# Patient Record
Sex: Female | Born: 1970
Health system: Southern US, Community
[De-identification: ages and names within clinical notes are randomized; demographics above are authoritative.]

---

## 1999-11-07 ENCOUNTER — Encounter: Admission: RE | Admit: 1999-11-07 | Discharge: 1999-11-07 | Payer: Self-pay | Admitting: Otolaryngology

## 1999-11-07 ENCOUNTER — Encounter: Payer: Self-pay | Admitting: Otolaryngology

## 2001-05-06 ENCOUNTER — Other Ambulatory Visit: Admission: RE | Admit: 2001-05-06 | Discharge: 2001-05-06 | Payer: Self-pay | Admitting: Family Medicine

## 2001-07-03 ENCOUNTER — Encounter: Admission: RE | Admit: 2001-07-03 | Discharge: 2001-07-03 | Payer: Self-pay | Admitting: Family Medicine

## 2001-07-03 ENCOUNTER — Encounter: Payer: Self-pay | Admitting: Family Medicine

## 2002-03-03 ENCOUNTER — Encounter: Payer: Self-pay | Admitting: Thoracic Surgery

## 2002-03-03 ENCOUNTER — Inpatient Hospital Stay (HOSPITAL_COMMUNITY): Admission: RE | Admit: 2002-03-03 | Discharge: 2002-03-05 | Payer: Self-pay | Admitting: Thoracic Surgery

## 2002-03-03 ENCOUNTER — Encounter (INDEPENDENT_AMBULATORY_CARE_PROVIDER_SITE_OTHER): Payer: Self-pay | Admitting: Specialist

## 2002-03-04 ENCOUNTER — Encounter: Payer: Self-pay | Admitting: Thoracic Surgery

## 2002-03-05 ENCOUNTER — Encounter: Payer: Self-pay | Admitting: Thoracic Surgery

## 2002-03-11 ENCOUNTER — Encounter: Payer: Self-pay | Admitting: Thoracic Surgery

## 2002-03-11 ENCOUNTER — Encounter: Admission: RE | Admit: 2002-03-11 | Discharge: 2002-03-11 | Payer: Self-pay | Admitting: Thoracic Surgery

## 2002-04-03 ENCOUNTER — Encounter (INDEPENDENT_AMBULATORY_CARE_PROVIDER_SITE_OTHER): Payer: Self-pay | Admitting: *Deleted

## 2002-04-03 ENCOUNTER — Inpatient Hospital Stay (HOSPITAL_COMMUNITY): Admission: RE | Admit: 2002-04-03 | Discharge: 2002-04-04 | Payer: Self-pay | Admitting: Thoracic Surgery

## 2002-04-03 ENCOUNTER — Encounter: Payer: Self-pay | Admitting: Thoracic Surgery

## 2002-04-04 ENCOUNTER — Encounter: Payer: Self-pay | Admitting: Thoracic Surgery

## 2002-04-10 ENCOUNTER — Encounter: Admission: RE | Admit: 2002-04-10 | Discharge: 2002-04-10 | Payer: Self-pay | Admitting: Thoracic Surgery

## 2002-04-10 ENCOUNTER — Encounter: Payer: Self-pay | Admitting: Thoracic Surgery

## 2003-07-23 ENCOUNTER — Other Ambulatory Visit: Admission: RE | Admit: 2003-07-23 | Discharge: 2003-07-23 | Payer: Self-pay | Admitting: Family Medicine

## 2004-08-10 ENCOUNTER — Other Ambulatory Visit: Admission: RE | Admit: 2004-08-10 | Discharge: 2004-08-10 | Payer: Self-pay | Admitting: Family Medicine

## 2005-09-18 ENCOUNTER — Other Ambulatory Visit: Admission: RE | Admit: 2005-09-18 | Discharge: 2005-09-18 | Payer: Self-pay | Admitting: Family Medicine

## 2006-09-24 ENCOUNTER — Other Ambulatory Visit: Admission: RE | Admit: 2006-09-24 | Discharge: 2006-09-24 | Payer: Self-pay | Admitting: Family Medicine

## 2007-10-02 ENCOUNTER — Other Ambulatory Visit: Admission: RE | Admit: 2007-10-02 | Discharge: 2007-10-02 | Payer: Self-pay | Admitting: Family Medicine

## 2008-09-03 ENCOUNTER — Encounter: Admission: RE | Admit: 2008-09-03 | Discharge: 2008-09-03 | Payer: Self-pay | Admitting: Family Medicine

## 2008-10-16 ENCOUNTER — Other Ambulatory Visit: Admission: RE | Admit: 2008-10-16 | Discharge: 2008-10-16 | Payer: Self-pay | Admitting: Family Medicine

## 2009-11-24 ENCOUNTER — Other Ambulatory Visit: Admission: RE | Admit: 2009-11-24 | Discharge: 2009-11-24 | Payer: Self-pay | Admitting: Family Medicine

## 2010-04-18 ENCOUNTER — Other Ambulatory Visit: Payer: Self-pay | Admitting: Family Medicine

## 2010-04-18 DIAGNOSIS — Z1231 Encounter for screening mammogram for malignant neoplasm of breast: Secondary | ICD-10-CM

## 2010-04-26 ENCOUNTER — Ambulatory Visit
Admission: RE | Admit: 2010-04-26 | Discharge: 2010-04-26 | Disposition: A | Discharge status: Home / Self Care | Payer: Self-pay | Source: Ambulatory Visit | Attending: Family Medicine | Admitting: Family Medicine

## 2010-04-26 DIAGNOSIS — Z1231 Encounter for screening mammogram for malignant neoplasm of breast: Secondary | ICD-10-CM

## 2010-05-03 ENCOUNTER — Other Ambulatory Visit: Payer: Self-pay | Admitting: Family Medicine

## 2010-05-03 DIAGNOSIS — R928 Other abnormal and inconclusive findings on diagnostic imaging of breast: Secondary | ICD-10-CM

## 2010-05-05 ENCOUNTER — Ambulatory Visit
Admission: RE | Admit: 2010-05-05 | Discharge: 2010-05-05 | Disposition: A | Discharge status: Home / Self Care | Payer: BC Managed Care – PPO | Source: Ambulatory Visit | Attending: Family Medicine | Admitting: Family Medicine

## 2010-05-05 DIAGNOSIS — R928 Other abnormal and inconclusive findings on diagnostic imaging of breast: Secondary | ICD-10-CM

## 2010-05-20 NOTE — H&P (Signed)
NAME:  Jade Harvey, Jade Harvey                          ACCOUNT NO.:  [id number]   MEDICAL RECORD NO.:  [id number]                   PATIENT TYPE:  INP   LOCATION:                                       FACILITY:  [facility]   PHYSICIAN:  Ines Bloomer, M.D.              DATE OF BIRTH:  March 11, 1970   DATE OF ADMISSION:  04/03/2002  DATE OF DISCHARGE:                                HISTORY & PHYSICAL   CHIEF COMPLAINT:  Hyperhidrosis.   HISTORY OF PRESENT ILLNESS:  Briefly, the patient is a 40 year old white  female with hyperhidrosis, both axilla, palms and groin areas.  She has  failed medical management and was referred to [facility] and Dr. Edwyna Shell.  She has  undergone a successful right dorsal sympathectomy using the video assisted  thorascopic technique on March 13, 2002 by Dr. Karle Plumber.  Postoperatively she has done quite well.  She has had good results and has  recovered completely from her procedure.  Her incisions are healing nicely.  She is readmitted at this time for left sided dorsal sympathectomy for her  hyperhidrosis.   PAST MEDICAL HISTORY:  She has a history of sinus problems.   PAST SURGICAL HISTORY:  Surgeries include right dorsal sympathectomy March 13, 2002 Dr. Edwyna Shell.   CURRENT MEDICATIONS:  1. Effexor 75 mg daily.  2. Allegra D one p.o. b.i.d.  3. Flonase 2 puffs daily.  4. Lortab 7.5/500 one to two p.o. q.4h. PRN.  5. Depo-Provera q.3 months.   ALLERGIES:  None.   REVIEW OF SYMPTOMS:  She has a history of sinus headaches.  The remainder of  the review of systems is negative.   FAMILY HISTORY:  Her mother is living age 35 with irritable bowel syndrome.  Her father is living at age 58 with some arthritis and hyperhidrosis.  She  also has a sister age 33 with hyperhidrosis.   SOCIAL HISTORY:  The patient is divorced.  She has no children.  She works  in Engineering geologist as a Production designer, theatre/television/film.  She still smokes about one-half pack per day of  cigarettes.   PHYSICAL EXAMINATION:   GENERAL:  This is a 40 year old white female well-  developed, well-nourished in no acute distress.  HEENT: Head is normocephalic.  Eyes: PERRLA.  EOM's intact.  Fundi not  visualized.  Ears, nose, throat and mouth grossly within normal limits.  NECK:  Supple.  No JVD, no bruits.  No thyromegaly.  No lymphadenopathy.  CHEST:  Clear to auscultation and percussion bilaterally.  The incision  sites for the right dorsal sympathectomy are all healing nicely and without  any postoperative problems.  CARDIAC:  Regular rate and rhythm with no murmurs, rubs or gallops.  ABDOMEN:  Soft, nontender, positive bowel sounds.  No palpable organomegaly.  GENITOURINARY/RECTAL:  Deferred.  EXTREMITIES:  No cyanosis, clubbing or edema.  NEUROLOGICAL:  No focal deficits.   IMPRESSION:  1. Bilateral hyperhidrosis.  2. Status post right video-assisted thoracoscopy and dorsal sympathectomy.   PLAN:  Left dorsal sympathectomy.     Eber Hong, P.A.                 Ines Bloomer, M.D.    WDJ/MEDQ  D:  04/01/2002  T:  04/01/2002  Job:  [id number]

## 2010-05-20 NOTE — Discharge Summary (Signed)
NAME:  Jade Harvey, Jade Harvey                        ACCOUNT NO.:  [id number]   MEDICAL RECORD NO.:  [id number]                   PATIENT TYPE:  INP   LOCATION:  3302                                 FACILITY:  [facility]   PHYSICIAN:  Ines Bloomer, M.D.              DATE OF BIRTH:  Jul 22, 1970   DATE OF ADMISSION:  03/03/2002  DATE OF DISCHARGE:  03/05/2002                                 DISCHARGE SUMMARY   ADMISSION DIAGNOSES:  1. Hyperhidrosis.  2. Anxiety.   DISCHARGE DIAGNOSES:  1. Hyperhidrosis.  2. Anxiety.   PROCEDURES:  Right video-assisted thoracoscopy and right dorsal  sympathectomy.   BRIEF HISTORY:  The patient is a 40 year old white female, a medical patient  of Dr. Nicki Guadalajara L. Su Hilt with hyperhidrosis both axilla and the palms.  She  has failed medical management, is referred to [facility] and Dr. Edwyna Shell.  Dr.  Edwyna Shell evaluated the patient and admitted her at this time for a right  dorsal sympathectomy.   PAST MEDICAL HISTORY:  Negative except for sinus polyps and some anxiety.   CURRENT MEDICATIONS:  1. Effexor 75 mg daily.  2. Allegra D one p.o. b.i.d.  3. Flonase two puffs daily.  4. Darvocet p.r.n. for sinus headaches.  5. Depo-Provera every three months.   ALLERGIES:  None.   For further history and physical, please see the dictated note.   HOSPITAL COURSE:  The patient was admitted, taken to the operating room,  underwent a right dorsal sympathectomy via the video-assisted thoracoscopy  route.  She tolerated the procedure well, returned to the recovery room then  3300 in satisfactory condition.  She was up and mobilized at postoperative  day #1 with no palmar sweating.  By postoperative day #2 she was ambulating  without difficulty.  Her incisions all looked good, her lungs were clear and  it was Dr. Scheryl Darter opinion she could be discharged home.   DISCHARGE MEDICATIONS:  She used Ultram for pain here and found that  effective so she was discharged home on  her preadmission medicines, along  with Ultram one to two p.o. q.4h. p.r.n.  She is also instructed that she  may use Tylenol in place of Ultram.   ACTIVITY:  She is to walk daily.  No driving or strenuous activity.   WOUND CARE:  She was to clean her incisions with plain soap and water and  keep them clean and dry.   FOLLOW-UP:  She will return to the [facility] one hour prior to seeing Dr. Edwyna Shell  next week and our office will call and arrange both her follow-up with [facility]  and to see Dr. Edwyna Shell.   CONDITION ON DISCHARGE:  Improving.     Eber Hong, P.A.                 Ines Bloomer, M.D.    WDJ/MEDQ  D:  03/05/2002  T:  03/05/2002  Job:  [id number]   cc:   Nicki Guadalajara L. Su Hilt, M.D.

## 2010-05-20 NOTE — Op Note (Signed)
   NAME:  Jade Harvey, Jade Harvey                        ACCOUNT NO.:  [id number]   MEDICAL RECORD NO.:  [id number]                   PATIENT TYPE:  INP   LOCATION:  3306                                 FACILITY:  [facility]   PHYSICIAN:  Ines Bloomer, M.D.              DATE OF BIRTH:  08-23-1970   DATE OF PROCEDURE:  DATE OF DISCHARGE:                                 OPERATIVE REPORT   PREOPERATIVE DIAGNOSIS:  Hyperhidrosis, axillary, palmar and plantar, and  inguinal hyperhidrosis.   POSTOPERATIVE DIAGNOSIS:  Hyperhidrosis, axillary, palmar and plantar, and  inguinal hyperhidrosis.   OPERATION PERFORMED:  Left dorsal sympathectomy.   SURGEON:  Ines Bloomer, M.D.   ANESTHESIA:  General anesthesia.   INDICATION:  This patient had a previous right dorsal sympathectomy with  excellent results for severe hyperhidrosis and now returns to do the left  side.   DESCRIPTION OF PROCEDURE:  She underwent general anesthesia and was prepped  and draped in the usual manner.  A 10-mm trocar site was made in the 4th  intercostal space at the mid-axillary line and a 10-mm trocar was inserted.  The lung was deflated and the patient was put in reversed Trendelenburg.  The sympathetic chain could be seen over the head of the 2nd rib and it was  followed down to the 3rd and 4th and 5th ribs.  The pleura over the chain  was opened with hook scissors as well as graspers, exposing the chain, and  the chain was grasped over the head of the 5th and divided with hook  scissors and with electrocautery, then it was brought up to the 4th and the  3rd, again dividing all rami with hook scissors and electrocautery.  Then  the chain was dissected up to the T2 ganglion and it was divided right at  the head of the 2nd rib and the chain was removed in several short pieces.  All bleeding was electrocoagulated.  A chest tube was placed through the mid  trocar site and tied in place with 0 silk.  The other 5-mm trocar  sites were  closed with 3-0 Vicryl and Dermabond.  The patient was then returned to the  recovery room in stable condition.  A Marcaine block had been done in the  usual fashion.                                               Ines Bloomer, M.D.    DPB/MEDQ  D:  04/03/2002  T:  04/04/2002  Job:  [id number]

## 2010-05-20 NOTE — H&P (Signed)
NAME:  Jade Harvey, Jade Harvey                        ACCOUNT NO.:  [id number]   MEDICAL RECORD NO.:  [id number]                   PATIENT TYPE:  INP   LOCATION:  NA                                   FACILITY:  [facility]   PHYSICIAN:  Ines Bloomer, M.D.              DATE OF BIRTH:  Jul 05, 1970   DATE OF ADMISSION:  DATE OF DISCHARGE:                                HISTORY & PHYSICAL   CHIEF COMPLAINT:  Hyperhidrosis.   BRIEF HISTORY:  The patient is a 40 year old white female with hyperhidrosis  both in the axilla and the palms.  She also is affected in the inguinal  areas.  She has failed medical management and was now referred to Dr. Edwyna Shell  by Dr. Shaune Pollack.  Dr. Edwyna Shell has evaluated the patient and he has  recommended right video-assisted thoracoscopy and dorsal sympathectomy.  The  patient is subsequently scheduled for admission for elective surgery at this  time.   PAST MEDICAL HISTORY:  Essentially negative.  She has sinus polyps and  frequent sinus headaches, otherwise negative.   PAST SURGICAL HISTORY:  She has had no surgeries.   CURRENT MEDICATIONS:  1. Effexor 75 mg daily.  2. Allegra-D 1 p.o. b.i.d.  3. Flonase 2 puffs daily.  4. Darvocet-N 100 p.r.n. for sinus headaches.  5. She is on Depo-Provera every three months.   ALLERGIES:  None known.   REVIEW OF SYSTEMS:  Completely negative except for her sinus headaches.   FAMILY HISTORY:  Her mother is living at age 17 with irritable bowel  syndrome.  Her father is living.  He has significant arthritis and  hyperhidrosis.  She has one sister with irritable bowel syndrome and  hyperhidrosis.   SOCIAL HISTORY:  She is divorced.  She has no children.  She works as a  Occupational hygienist at [employer].  She smokes a half a pack a day and has for the  last 10 years.   PHYSICAL EXAMINATION:  GENERAL:  This is a well-nourished, well-developed  white female in no acute distress.  The patient is very anxious but is  otherwise  alert and oriented and in no distress.  VITAL SIGNS:  Blood pressure is 110/60 in the right arm and 98/60 in the  left arm.  Pulse is 96 and regular.  HEENT:  Normocephalic.  Eyes:  PERRLA.  EOM's intact.  Fundi not visualized.  Ears, nose, throat, and mouth:  Grossly within normal limits.  NECK:  Supple.  No JVD, no bruits, no thyromegaly, no lymphadenopathy.  CHEST:  Symmetrical.  No wheezes, rales, or rhonchi.  Clear to auscultation  and percussion.  HEART:  Regular rate and rhythm.  No murmurs, rubs, or gallops.  ABDOMEN:  Soft, nontender.  Positive bowel sounds.  No hepatosplenomegaly.  GENITOURINARY/RECTAL:  Deferred.  EXTREMITIES:  No clubbing, cyanosis, or edema.  Pulse are present and equal  bilaterally throughout the distribution.  NEUROLOGIC:  Grossly within normal limits.   IMPRESSION:  1. Hyperhidrosis.  2. Anxiety.   PLAN:  The patient is admitted for elective right video-assisted  thoracoscopy and dorsal sympathectomy.     Eber Hong, P.A.                 Ines Bloomer, M.D.    WDJ/MEDQ  D:  02/27/2002  T:  02/27/2002  Job:  [id number]

## 2010-05-20 NOTE — Discharge Summary (Signed)
NAME:  Jade Harvey, Jade Harvey                        ACCOUNT NO.:  [id number]   MEDICAL RECORD NO.:  [id number]                   PATIENT TYPE:  INP   LOCATION:  3306                                 FACILITY:  [facility]   PHYSICIAN:  Ines Bloomer, M.D.              DATE OF BIRTH:  20-Apr-1970   DATE OF ADMISSION:  04/03/2002  DATE OF DISCHARGE:  04/04/2002                                 DISCHARGE SUMMARY   ADMISSION DIAGNOSIS:  Bilateral hyperhidrosis.   BRIEF HISTORY:  The patient is a 40 year old white female with bilateral  hyperhidrosis of both axillae and palms. She has failed medical management  and was referred to [facility] and Dr. Karle Plumber by Dr. Shaune Pollack. She  underwent a successful right dorsal sympathectomy on 03/13/02 with good  postoperative results. She now returns for elective left sided dorsal  sympathectomy to relieve her symptoms of hyperhidrosis.   PAST MEDICAL HISTORY:  1. Sinus polyps.  2. History of allergies.  3. Some mild depression.   PAST SURGICAL HISTORY:  Right dorsal sympathectomy 03/13/02.   MEDICATIONS ON ADMISSION:  1. Effexor 75 mg q.d.  2. Allegra D one p.o. b.i.d.  3. Flonase two puffs q.d. each nostril.  4. Lortab 7.5/500 one to two p.o. q.4h. p.r.n.  5. Depo-Provera every three months.   For further history and physical, please see the dictated note.   HOSPITAL COURSE:  The patient was admitted and taken to the operating room  on day of admission and underwent left dorsal sympathectomy using a video-  assisted thoracoscopic approach. The patient tolerated the procedure well  and returned to the recovery room and then returned to 3300 in satisfactory  condition. She had a good postoperative evening. She had no air leak, and  her chest tube was removed on the first postoperative morning. She was  mobilized and was able to eat, drink, walk, and void without difficulty.  Chest x-ray showed lung fully expanded after chest tube was removed, and  she  was subsequently discharge home.   DISCHARGE INSTRUCTIONS:  She was discharged home on her preadmission  medicines listed above and Tylox one to two p.o. q.4h. p.r.n. She was also  told that she could use Tylenol 325 mg two or three every four hours p.r.n.  for pain, if the Tylox was too strong. She was encouraged to walk daily. No  lifting of 10 pounds. No driving. She  was to clean her wounds with plain soap and water. She will return to see  Dr. Edwyna Shell in one week with a chest x-ray from the [facility].   CONDITION ON DISCHARGE:  Improving.     Eber Hong, P.A.                 Ines Bloomer, M.D.    WDJ/MEDQ  D:  04/04/2002  T:  04/07/2002  Job:  [id number]   cc:  Duncan Dull, M.D.  [address]  [place]  [place] [address]  Fax: [phone]

## 2010-05-20 NOTE — Op Note (Signed)
NAME:  Jade Harvey, Jade Harvey                        ACCOUNT NO.:  [id number]   MEDICAL RECORD NO.:  [id number]                   PATIENT TYPE:  INP   LOCATION:  2899                                 FACILITY:  [facility]   PHYSICIAN:  Ines Bloomer, M.D.              DATE OF BIRTH:  [birth year]   DATE OF PROCEDURE:  DATE OF DISCHARGE:                                 OPERATIVE REPORT   PREOPERATIVE DIAGNOSIS:  Severe palmar and axillary hyperhidrosis.   POSTOPERATIVE DIAGNOSIS:  Severe palmer and axillary hyperhidrosis.   OPERATION PERFORMED:  Right dorsal sympathectomy.   SURGEON:  Ines Bloomer, M.D.   FIRST ASSISTANT:  Eber Hong, P.A.  Emerson Monte, RN, FA   This patient  has had severe hyperhidrosis all of her life affecting both  palms plantar surfaces, some mild facial sweating, severe axillary sweating  causing changes of clothes and some inguinal sweating.  Because of this and  failure of all other treatments, she has decided to undergo a right dorsal  sympathectomy.  Risks of the procedure including bleeding, Horner's syndrome  were explained to her as well as infection and she understood the risks and  agreed with the surgery.   After general anesthesia and insertion of a dual-lumen tube, the right lung  was collapsed and she was prepped and draped in the usual sterile manner.  Three trocar sites were made.  One in the fourth intercostal space at the  mid axial line and two at the third intercostal space.  A 5-mm scope was  inserted.  The mid axillary line was 10-mm trocar and the other two were 5-  mm trocars.  The lung was compressed inferiorly with a forceps and the  sympathetic nerve was identified at T2, 3, 4 and 5.  The pleura was opened  over the nerve and then the nerve was elevated.  Right around T2 there was  an intercostal vein that had to be clipped with Liga clips.  The nerve was  divided right above T2 to diffuse trauma on the stellate ganglion and  then  the nerve was dissected out down to T5.  The nerve, unfortunately, as we  were doing dissection the ganglions were removed but the connection of the  ganglion had to be removed in a stepwise fashion.  Could not remove the  entire chain in one dissection, but had to remove each ganglion separately.  All branches were electrocoagulated or clipped.  At the T5 ganglion, the  nerve was then electrocoagulated there.  After the T2, 3 and 4 and part of 5  were removed, the area was irrigated copiously.  A chest tube was inserted  through the lower trocar site and tied in place with 0 silk.  The other two  trocar sites were closed with 3-0 Vicryl.  The patient returned to recovery  room in stable condition.  Ines Bloomer, M.D.    DPB/MEDQ  D:  03/03/2002  T:  03/03/2002  Job:  [id number]

## 2010-11-16 ENCOUNTER — Other Ambulatory Visit: Payer: Self-pay | Admitting: Family Medicine

## 2010-11-16 DIAGNOSIS — N6489 Other specified disorders of breast: Secondary | ICD-10-CM

## 2011-01-04 ENCOUNTER — Ambulatory Visit
Admission: RE | Admit: 2011-01-04 | Discharge: 2011-01-04 | Disposition: A | Discharge status: Home / Self Care | Payer: BC Managed Care – PPO | Source: Ambulatory Visit | Attending: Family Medicine | Admitting: Family Medicine

## 2011-01-04 DIAGNOSIS — N6489 Other specified disorders of breast: Secondary | ICD-10-CM

## 2011-10-03 ENCOUNTER — Ambulatory Visit (INDEPENDENT_AMBULATORY_CARE_PROVIDER_SITE_OTHER): Payer: BC Managed Care – PPO | Admitting: Licensed Clinical Social Worker

## 2011-10-03 DIAGNOSIS — F331 Major depressive disorder, recurrent, moderate: Secondary | ICD-10-CM

## 2011-10-19 ENCOUNTER — Ambulatory Visit (INDEPENDENT_AMBULATORY_CARE_PROVIDER_SITE_OTHER): Payer: BC Managed Care – PPO | Admitting: Licensed Clinical Social Worker

## 2011-10-19 DIAGNOSIS — F331 Major depressive disorder, recurrent, moderate: Secondary | ICD-10-CM

## 2011-11-07 ENCOUNTER — Ambulatory Visit: Payer: BC Managed Care – PPO | Admitting: Licensed Clinical Social Worker

## 2011-11-23 ENCOUNTER — Ambulatory Visit (INDEPENDENT_AMBULATORY_CARE_PROVIDER_SITE_OTHER): Payer: BC Managed Care – PPO | Admitting: Licensed Clinical Social Worker

## 2011-11-23 DIAGNOSIS — F331 Major depressive disorder, recurrent, moderate: Secondary | ICD-10-CM

## 2011-12-07 ENCOUNTER — Ambulatory Visit: Payer: BC Managed Care – PPO | Admitting: Licensed Clinical Social Worker

## 2011-12-12 ENCOUNTER — Ambulatory Visit (INDEPENDENT_AMBULATORY_CARE_PROVIDER_SITE_OTHER): Payer: BC Managed Care – PPO | Admitting: Licensed Clinical Social Worker

## 2011-12-12 DIAGNOSIS — F331 Major depressive disorder, recurrent, moderate: Secondary | ICD-10-CM

## 2012-01-09 ENCOUNTER — Ambulatory Visit (INDEPENDENT_AMBULATORY_CARE_PROVIDER_SITE_OTHER): Payer: BC Managed Care – PPO | Admitting: Licensed Clinical Social Worker

## 2012-01-09 DIAGNOSIS — F331 Major depressive disorder, recurrent, moderate: Secondary | ICD-10-CM

## 2012-02-06 ENCOUNTER — Ambulatory Visit: Payer: BC Managed Care – PPO | Admitting: Licensed Clinical Social Worker

## 2012-02-13 ENCOUNTER — Ambulatory Visit (INDEPENDENT_AMBULATORY_CARE_PROVIDER_SITE_OTHER): Payer: BC Managed Care – PPO | Admitting: Licensed Clinical Social Worker

## 2012-02-13 DIAGNOSIS — F331 Major depressive disorder, recurrent, moderate: Secondary | ICD-10-CM

## 2012-03-26 ENCOUNTER — Ambulatory Visit: Payer: BC Managed Care – PPO | Admitting: Licensed Clinical Social Worker

## 2012-10-29 ENCOUNTER — Other Ambulatory Visit: Payer: Self-pay | Admitting: Family Medicine

## 2012-10-29 ENCOUNTER — Ambulatory Visit
Admission: RE | Admit: 2012-10-29 | Discharge: 2012-10-29 | Disposition: A | Discharge status: Home / Self Care | Payer: BC Managed Care – PPO | Source: Ambulatory Visit | Attending: Family Medicine | Admitting: Family Medicine

## 2012-10-29 DIAGNOSIS — M25552 Pain in left hip: Secondary | ICD-10-CM

## 2012-10-29 DIAGNOSIS — R9389 Abnormal findings on diagnostic imaging of other specified body structures: Secondary | ICD-10-CM

## 2012-10-30 ENCOUNTER — Ambulatory Visit
Admission: RE | Admit: 2012-10-30 | Discharge: 2012-10-30 | Disposition: A | Discharge status: Home / Self Care | Payer: BC Managed Care – PPO | Source: Ambulatory Visit | Attending: Family Medicine | Admitting: Family Medicine

## 2012-10-30 DIAGNOSIS — R9389 Abnormal findings on diagnostic imaging of other specified body structures: Secondary | ICD-10-CM

## 2012-12-16 ENCOUNTER — Other Ambulatory Visit (HOSPITAL_COMMUNITY)
Admission: RE | Admit: 2012-12-16 | Discharge: 2012-12-16 | Disposition: A | Discharge status: Home / Self Care | Payer: BC Managed Care – PPO | Source: Ambulatory Visit | Attending: Family Medicine | Admitting: Family Medicine

## 2012-12-16 ENCOUNTER — Other Ambulatory Visit: Payer: Self-pay | Admitting: Family Medicine

## 2012-12-16 DIAGNOSIS — Z124 Encounter for screening for malignant neoplasm of cervix: Secondary | ICD-10-CM | POA: Insufficient documentation

## 2013-05-15 ENCOUNTER — Other Ambulatory Visit: Payer: Self-pay | Admitting: Family Medicine

## 2013-05-15 DIAGNOSIS — N6489 Other specified disorders of breast: Secondary | ICD-10-CM

## 2013-05-28 ENCOUNTER — Ambulatory Visit
Admission: RE | Admit: 2013-05-28 | Discharge: 2013-05-28 | Disposition: A | Discharge status: Home / Self Care | Payer: BC Managed Care – PPO | Source: Ambulatory Visit | Attending: Family Medicine | Admitting: Family Medicine

## 2013-05-28 ENCOUNTER — Other Ambulatory Visit: Payer: Self-pay | Admitting: Family Medicine

## 2013-05-28 DIAGNOSIS — N6489 Other specified disorders of breast: Secondary | ICD-10-CM

## 2014-06-30 ENCOUNTER — Ambulatory Visit (INDEPENDENT_AMBULATORY_CARE_PROVIDER_SITE_OTHER): Payer: BLUE CROSS/BLUE SHIELD | Admitting: Licensed Clinical Social Worker

## 2014-06-30 DIAGNOSIS — F332 Major depressive disorder, recurrent severe without psychotic features: Secondary | ICD-10-CM

## 2014-07-14 ENCOUNTER — Ambulatory Visit (INDEPENDENT_AMBULATORY_CARE_PROVIDER_SITE_OTHER): Payer: BLUE CROSS/BLUE SHIELD | Admitting: Licensed Clinical Social Worker

## 2014-07-14 DIAGNOSIS — F332 Major depressive disorder, recurrent severe without psychotic features: Secondary | ICD-10-CM | POA: Diagnosis not present

## 2014-07-30 ENCOUNTER — Ambulatory Visit (INDEPENDENT_AMBULATORY_CARE_PROVIDER_SITE_OTHER): Payer: BLUE CROSS/BLUE SHIELD | Admitting: Licensed Clinical Social Worker

## 2014-07-30 DIAGNOSIS — F332 Major depressive disorder, recurrent severe without psychotic features: Secondary | ICD-10-CM | POA: Diagnosis not present

## 2014-08-27 ENCOUNTER — Ambulatory Visit (INDEPENDENT_AMBULATORY_CARE_PROVIDER_SITE_OTHER): Payer: BLUE CROSS/BLUE SHIELD | Admitting: Licensed Clinical Social Worker

## 2014-08-27 DIAGNOSIS — F332 Major depressive disorder, recurrent severe without psychotic features: Secondary | ICD-10-CM

## 2014-10-16 ENCOUNTER — Other Ambulatory Visit: Payer: Self-pay

## 2014-10-16 DIAGNOSIS — Z1231 Encounter for screening mammogram for malignant neoplasm of breast: Secondary | ICD-10-CM

## 2014-11-10 ENCOUNTER — Ambulatory Visit
Admission: RE | Admit: 2014-11-10 | Discharge: 2014-11-10 | Disposition: A | Discharge status: Home / Self Care | Payer: BLUE CROSS/BLUE SHIELD | Source: Ambulatory Visit

## 2014-11-10 DIAGNOSIS — Z1231 Encounter for screening mammogram for malignant neoplasm of breast: Secondary | ICD-10-CM

## 2015-05-24 DIAGNOSIS — Z309 Encounter for contraceptive management, unspecified: Secondary | ICD-10-CM | POA: Diagnosis not present

## 2015-07-12 DIAGNOSIS — L659 Nonscarring hair loss, unspecified: Secondary | ICD-10-CM | POA: Diagnosis not present

## 2015-07-12 DIAGNOSIS — B373 Candidiasis of vulva and vagina: Secondary | ICD-10-CM | POA: Diagnosis not present

## 2015-07-12 DIAGNOSIS — E559 Vitamin D deficiency, unspecified: Secondary | ICD-10-CM | POA: Diagnosis not present

## 2015-07-12 DIAGNOSIS — F419 Anxiety disorder, unspecified: Secondary | ICD-10-CM | POA: Diagnosis not present

## 2015-07-28 DIAGNOSIS — L400 Psoriasis vulgaris: Secondary | ICD-10-CM | POA: Diagnosis not present

## 2015-08-19 DIAGNOSIS — Z309 Encounter for contraceptive management, unspecified: Secondary | ICD-10-CM | POA: Diagnosis not present

## 2015-10-26 ENCOUNTER — Other Ambulatory Visit: Payer: Self-pay | Admitting: Family Medicine

## 2015-10-26 DIAGNOSIS — Z1231 Encounter for screening mammogram for malignant neoplasm of breast: Secondary | ICD-10-CM

## 2015-11-12 DIAGNOSIS — Z309 Encounter for contraceptive management, unspecified: Secondary | ICD-10-CM | POA: Diagnosis not present

## 2015-11-12 DIAGNOSIS — Z23 Encounter for immunization: Secondary | ICD-10-CM | POA: Diagnosis not present

## 2015-11-16 ENCOUNTER — Ambulatory Visit
Admission: RE | Admit: 2015-11-16 | Discharge: 2015-11-16 | Disposition: A | Discharge status: Home / Self Care | Payer: BLUE CROSS/BLUE SHIELD | Source: Ambulatory Visit | Attending: Family Medicine | Admitting: Family Medicine

## 2015-11-16 DIAGNOSIS — Z1231 Encounter for screening mammogram for malignant neoplasm of breast: Secondary | ICD-10-CM | POA: Diagnosis not present

## 2016-01-17 ENCOUNTER — Other Ambulatory Visit: Payer: Self-pay | Admitting: Family Medicine

## 2016-01-17 ENCOUNTER — Other Ambulatory Visit (HOSPITAL_COMMUNITY)
Admission: RE | Admit: 2016-01-17 | Discharge: 2016-01-17 | Disposition: A | Discharge status: Home / Self Care | Payer: BLUE CROSS/BLUE SHIELD | Source: Ambulatory Visit | Attending: Family Medicine | Admitting: Family Medicine

## 2016-01-17 DIAGNOSIS — Z01411 Encounter for gynecological examination (general) (routine) with abnormal findings: Secondary | ICD-10-CM | POA: Diagnosis not present

## 2016-01-17 DIAGNOSIS — Z124 Encounter for screening for malignant neoplasm of cervix: Secondary | ICD-10-CM | POA: Diagnosis not present

## 2016-01-17 DIAGNOSIS — Z0000 Encounter for general adult medical examination without abnormal findings: Secondary | ICD-10-CM | POA: Diagnosis not present

## 2016-01-17 DIAGNOSIS — Z23 Encounter for immunization: Secondary | ICD-10-CM | POA: Diagnosis not present

## 2016-01-17 DIAGNOSIS — E7800 Pure hypercholesterolemia, unspecified: Secondary | ICD-10-CM | POA: Diagnosis not present

## 2016-01-18 LAB — CYTOLOGY - PAP: Diagnosis: NEGATIVE

## 2016-02-04 DIAGNOSIS — Z309 Encounter for contraceptive management, unspecified: Secondary | ICD-10-CM | POA: Diagnosis not present

## 2016-03-08 DIAGNOSIS — B309 Viral conjunctivitis, unspecified: Secondary | ICD-10-CM | POA: Diagnosis not present

## 2016-03-15 DIAGNOSIS — H20011 Primary iridocyclitis, right eye: Secondary | ICD-10-CM | POA: Diagnosis not present

## 2016-05-02 DIAGNOSIS — Z3042 Encounter for surveillance of injectable contraceptive: Secondary | ICD-10-CM | POA: Diagnosis not present

## 2016-07-10 DIAGNOSIS — E559 Vitamin D deficiency, unspecified: Secondary | ICD-10-CM | POA: Diagnosis not present

## 2016-07-10 DIAGNOSIS — F419 Anxiety disorder, unspecified: Secondary | ICD-10-CM | POA: Diagnosis not present

## 2016-08-01 DIAGNOSIS — E559 Vitamin D deficiency, unspecified: Secondary | ICD-10-CM | POA: Diagnosis not present

## 2016-08-01 DIAGNOSIS — Z3042 Encounter for surveillance of injectable contraceptive: Secondary | ICD-10-CM | POA: Diagnosis not present

## 2016-10-18 DIAGNOSIS — H20011 Primary iridocyclitis, right eye: Secondary | ICD-10-CM | POA: Diagnosis not present

## 2016-10-26 DIAGNOSIS — Z23 Encounter for immunization: Secondary | ICD-10-CM | POA: Diagnosis not present

## 2016-11-16 ENCOUNTER — Other Ambulatory Visit: Payer: Self-pay | Admitting: Family Medicine

## 2016-11-16 DIAGNOSIS — Z1231 Encounter for screening mammogram for malignant neoplasm of breast: Secondary | ICD-10-CM

## 2016-12-15 ENCOUNTER — Ambulatory Visit: Payer: Self-pay

## 2017-01-11 ENCOUNTER — Ambulatory Visit
Admission: RE | Admit: 2017-01-11 | Discharge: 2017-01-11 | Disposition: A | Discharge status: Home / Self Care | Payer: BLUE CROSS/BLUE SHIELD | Source: Ambulatory Visit | Attending: Family Medicine | Admitting: Family Medicine

## 2017-01-11 DIAGNOSIS — Z1231 Encounter for screening mammogram for malignant neoplasm of breast: Secondary | ICD-10-CM

## 2017-01-17 DIAGNOSIS — Z3042 Encounter for surveillance of injectable contraceptive: Secondary | ICD-10-CM | POA: Diagnosis not present

## 2017-02-01 DIAGNOSIS — Z0000 Encounter for general adult medical examination without abnormal findings: Secondary | ICD-10-CM | POA: Diagnosis not present

## 2017-02-01 DIAGNOSIS — N898 Other specified noninflammatory disorders of vagina: Secondary | ICD-10-CM | POA: Diagnosis not present

## 2017-02-01 DIAGNOSIS — E559 Vitamin D deficiency, unspecified: Secondary | ICD-10-CM | POA: Diagnosis not present

## 2017-02-01 DIAGNOSIS — E7800 Pure hypercholesterolemia, unspecified: Secondary | ICD-10-CM | POA: Diagnosis not present

## 2017-02-01 DIAGNOSIS — F419 Anxiety disorder, unspecified: Secondary | ICD-10-CM | POA: Diagnosis not present

## 2017-04-16 DIAGNOSIS — Z3042 Encounter for surveillance of injectable contraceptive: Secondary | ICD-10-CM | POA: Diagnosis not present

## 2017-07-13 DIAGNOSIS — Z3042 Encounter for surveillance of injectable contraceptive: Secondary | ICD-10-CM | POA: Diagnosis not present

## 2017-08-02 DIAGNOSIS — G250 Essential tremor: Secondary | ICD-10-CM | POA: Diagnosis not present

## 2017-08-02 DIAGNOSIS — E559 Vitamin D deficiency, unspecified: Secondary | ICD-10-CM | POA: Diagnosis not present

## 2017-08-02 DIAGNOSIS — E7800 Pure hypercholesterolemia, unspecified: Secondary | ICD-10-CM | POA: Diagnosis not present

## 2017-08-02 DIAGNOSIS — F419 Anxiety disorder, unspecified: Secondary | ICD-10-CM | POA: Diagnosis not present

## 2017-08-23 DIAGNOSIS — E7800 Pure hypercholesterolemia, unspecified: Secondary | ICD-10-CM | POA: Diagnosis not present

## 2017-08-23 DIAGNOSIS — E559 Vitamin D deficiency, unspecified: Secondary | ICD-10-CM | POA: Diagnosis not present

## 2017-10-08 DIAGNOSIS — Z3042 Encounter for surveillance of injectable contraceptive: Secondary | ICD-10-CM | POA: Diagnosis not present

## 2017-10-10 DIAGNOSIS — Z23 Encounter for immunization: Secondary | ICD-10-CM | POA: Diagnosis not present

## 2017-10-18 DIAGNOSIS — Z79899 Other long term (current) drug therapy: Secondary | ICD-10-CM | POA: Diagnosis not present

## 2017-10-18 DIAGNOSIS — E7800 Pure hypercholesterolemia, unspecified: Secondary | ICD-10-CM | POA: Diagnosis not present

## 2017-12-19 DIAGNOSIS — E7800 Pure hypercholesterolemia, unspecified: Secondary | ICD-10-CM | POA: Diagnosis not present

## 2017-12-19 DIAGNOSIS — F419 Anxiety disorder, unspecified: Secondary | ICD-10-CM | POA: Diagnosis not present

## 2018-01-04 DIAGNOSIS — Z3042 Encounter for surveillance of injectable contraceptive: Secondary | ICD-10-CM | POA: Diagnosis not present

## 2018-01-09 ENCOUNTER — Other Ambulatory Visit: Payer: Self-pay | Admitting: Family Medicine

## 2018-01-09 DIAGNOSIS — Z1231 Encounter for screening mammogram for malignant neoplasm of breast: Secondary | ICD-10-CM

## 2018-02-06 ENCOUNTER — Ambulatory Visit
Admission: RE | Admit: 2018-02-06 | Discharge: 2018-02-06 | Disposition: A | Discharge status: Home / Self Care | Payer: BLUE CROSS/BLUE SHIELD | Source: Ambulatory Visit | Attending: Family Medicine | Admitting: Family Medicine

## 2018-02-06 ENCOUNTER — Other Ambulatory Visit: Payer: Self-pay | Admitting: Family Medicine

## 2018-02-06 DIAGNOSIS — Z1231 Encounter for screening mammogram for malignant neoplasm of breast: Secondary | ICD-10-CM | POA: Diagnosis not present

## 2018-02-27 DIAGNOSIS — Z79899 Other long term (current) drug therapy: Secondary | ICD-10-CM | POA: Diagnosis not present

## 2018-02-27 DIAGNOSIS — E559 Vitamin D deficiency, unspecified: Secondary | ICD-10-CM | POA: Diagnosis not present

## 2018-02-27 DIAGNOSIS — E7800 Pure hypercholesterolemia, unspecified: Secondary | ICD-10-CM | POA: Diagnosis not present

## 2018-02-27 DIAGNOSIS — Z0000 Encounter for general adult medical examination without abnormal findings: Secondary | ICD-10-CM | POA: Diagnosis not present

## 2018-04-08 DIAGNOSIS — Z3042 Encounter for surveillance of injectable contraceptive: Secondary | ICD-10-CM | POA: Diagnosis not present

## 2018-07-02 DIAGNOSIS — Z3042 Encounter for surveillance of injectable contraceptive: Secondary | ICD-10-CM | POA: Diagnosis not present

## 2018-08-15 DIAGNOSIS — H2000 Unspecified acute and subacute iridocyclitis: Secondary | ICD-10-CM | POA: Diagnosis not present

## 2018-08-29 DIAGNOSIS — H2000 Unspecified acute and subacute iridocyclitis: Secondary | ICD-10-CM | POA: Diagnosis not present

## 2018-09-02 DIAGNOSIS — H2000 Unspecified acute and subacute iridocyclitis: Secondary | ICD-10-CM | POA: Diagnosis not present

## 2018-09-24 DIAGNOSIS — Z23 Encounter for immunization: Secondary | ICD-10-CM | POA: Diagnosis not present

## 2018-09-24 DIAGNOSIS — F419 Anxiety disorder, unspecified: Secondary | ICD-10-CM | POA: Diagnosis not present

## 2018-09-24 DIAGNOSIS — G250 Essential tremor: Secondary | ICD-10-CM | POA: Diagnosis not present

## 2018-09-24 DIAGNOSIS — Z3042 Encounter for surveillance of injectable contraceptive: Secondary | ICD-10-CM | POA: Diagnosis not present

## 2018-09-30 DIAGNOSIS — Z3042 Encounter for surveillance of injectable contraceptive: Secondary | ICD-10-CM | POA: Diagnosis not present

## 2018-10-07 DIAGNOSIS — H2000 Unspecified acute and subacute iridocyclitis: Secondary | ICD-10-CM | POA: Diagnosis not present

## 2018-10-24 DIAGNOSIS — F419 Anxiety disorder, unspecified: Secondary | ICD-10-CM | POA: Diagnosis not present

## 2018-10-24 DIAGNOSIS — G250 Essential tremor: Secondary | ICD-10-CM | POA: Diagnosis not present

## 2018-12-25 DIAGNOSIS — Z3042 Encounter for surveillance of injectable contraceptive: Secondary | ICD-10-CM | POA: Diagnosis not present

## 2019-01-23 ENCOUNTER — Other Ambulatory Visit: Payer: Self-pay | Admitting: Family Medicine

## 2019-01-23 DIAGNOSIS — Z1231 Encounter for screening mammogram for malignant neoplasm of breast: Secondary | ICD-10-CM

## 2019-02-24 ENCOUNTER — Other Ambulatory Visit: Payer: Self-pay

## 2019-02-24 ENCOUNTER — Ambulatory Visit
Admission: RE | Admit: 2019-02-24 | Discharge: 2019-02-24 | Disposition: A | Discharge status: Home / Self Care | Payer: BC Managed Care – PPO | Source: Ambulatory Visit | Attending: Family Medicine | Admitting: Family Medicine

## 2019-02-24 DIAGNOSIS — Z1231 Encounter for screening mammogram for malignant neoplasm of breast: Secondary | ICD-10-CM

## 2019-02-26 ENCOUNTER — Other Ambulatory Visit: Payer: Self-pay | Admitting: Family Medicine

## 2019-02-26 DIAGNOSIS — R928 Other abnormal and inconclusive findings on diagnostic imaging of breast: Secondary | ICD-10-CM

## 2019-02-28 ENCOUNTER — Other Ambulatory Visit (HOSPITAL_COMMUNITY)
Admission: RE | Admit: 2019-02-28 | Discharge: 2019-02-28 | Disposition: A | Discharge status: Home / Self Care | Payer: BC Managed Care – PPO | Source: Ambulatory Visit | Attending: Family Medicine | Admitting: Family Medicine

## 2019-02-28 DIAGNOSIS — Z01411 Encounter for gynecological examination (general) (routine) with abnormal findings: Secondary | ICD-10-CM | POA: Diagnosis not present

## 2019-02-28 DIAGNOSIS — L659 Nonscarring hair loss, unspecified: Secondary | ICD-10-CM | POA: Diagnosis not present

## 2019-02-28 DIAGNOSIS — F419 Anxiety disorder, unspecified: Secondary | ICD-10-CM | POA: Diagnosis not present

## 2019-02-28 DIAGNOSIS — E7800 Pure hypercholesterolemia, unspecified: Secondary | ICD-10-CM | POA: Diagnosis not present

## 2019-02-28 DIAGNOSIS — Z0000 Encounter for general adult medical examination without abnormal findings: Secondary | ICD-10-CM | POA: Diagnosis not present

## 2019-02-28 DIAGNOSIS — Z309 Encounter for contraceptive management, unspecified: Secondary | ICD-10-CM | POA: Diagnosis not present

## 2019-02-28 DIAGNOSIS — Z79899 Other long term (current) drug therapy: Secondary | ICD-10-CM | POA: Diagnosis not present

## 2019-03-04 LAB — CYTOLOGY - PAP
Adequacy: ABSENT
Comment: NEGATIVE
Diagnosis: NEGATIVE
High risk HPV: NEGATIVE

## 2019-03-07 ENCOUNTER — Other Ambulatory Visit: Payer: Self-pay

## 2019-03-07 ENCOUNTER — Ambulatory Visit
Admission: RE | Admit: 2019-03-07 | Discharge: 2019-03-07 | Disposition: A | Discharge status: Home / Self Care | Payer: BC Managed Care – PPO | Source: Ambulatory Visit | Attending: Family Medicine | Admitting: Family Medicine

## 2019-03-07 DIAGNOSIS — R928 Other abnormal and inconclusive findings on diagnostic imaging of breast: Secondary | ICD-10-CM

## 2019-03-07 DIAGNOSIS — N6001 Solitary cyst of right breast: Secondary | ICD-10-CM | POA: Diagnosis not present

## 2019-03-07 DIAGNOSIS — R922 Inconclusive mammogram: Secondary | ICD-10-CM | POA: Diagnosis not present

## 2019-03-12 DIAGNOSIS — Z3042 Encounter for surveillance of injectable contraceptive: Secondary | ICD-10-CM | POA: Diagnosis not present

## 2019-03-20 DIAGNOSIS — H2000 Unspecified acute and subacute iridocyclitis: Secondary | ICD-10-CM | POA: Diagnosis not present

## 2019-04-21 DIAGNOSIS — H2000 Unspecified acute and subacute iridocyclitis: Secondary | ICD-10-CM | POA: Diagnosis not present

## 2019-05-08 IMAGING — MG DIGITAL SCREENING BILATERAL MAMMOGRAM WITH CAD
7 series · 7 of 7 positions shown · non-contrast
Comparison: Previous exam(s).

CLINICAL DATA: Screening.

EXAM:
DIGITAL SCREENING BILATERAL MAMMOGRAM WITH CAD

[L CC]
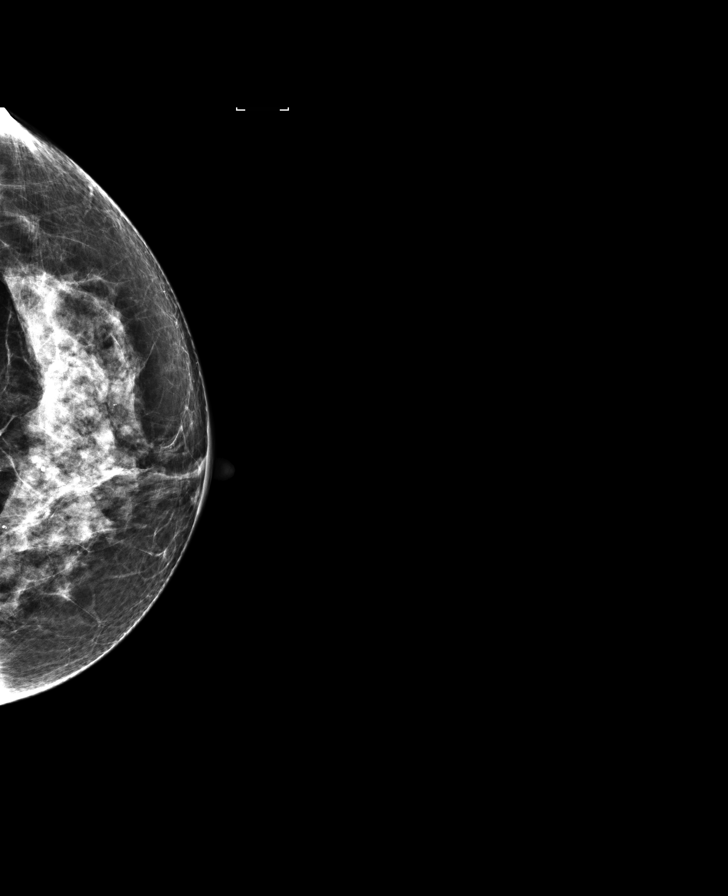

[L MLO (1 of 3)]
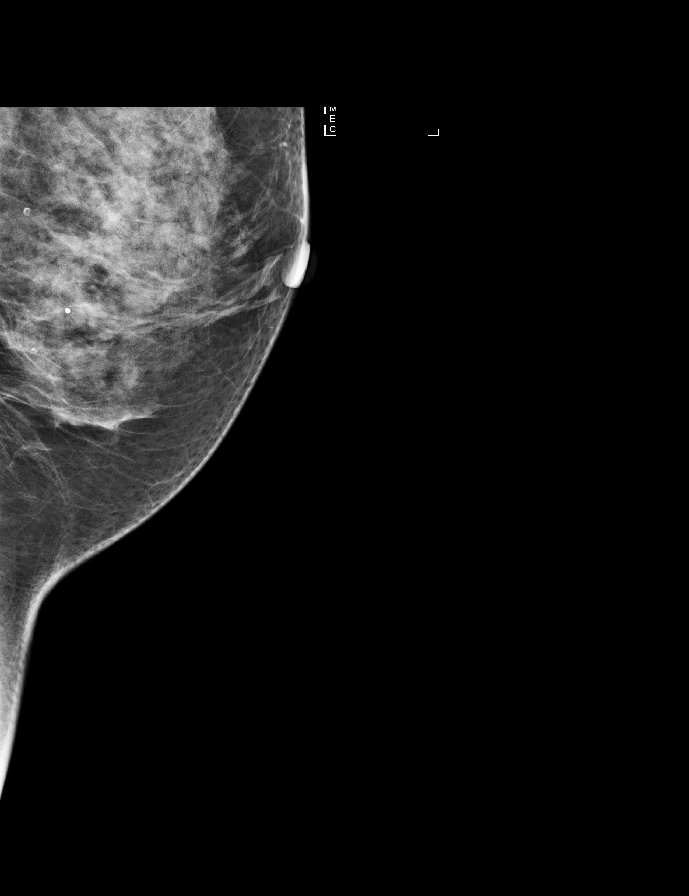

[L MLO (2 of 3)]
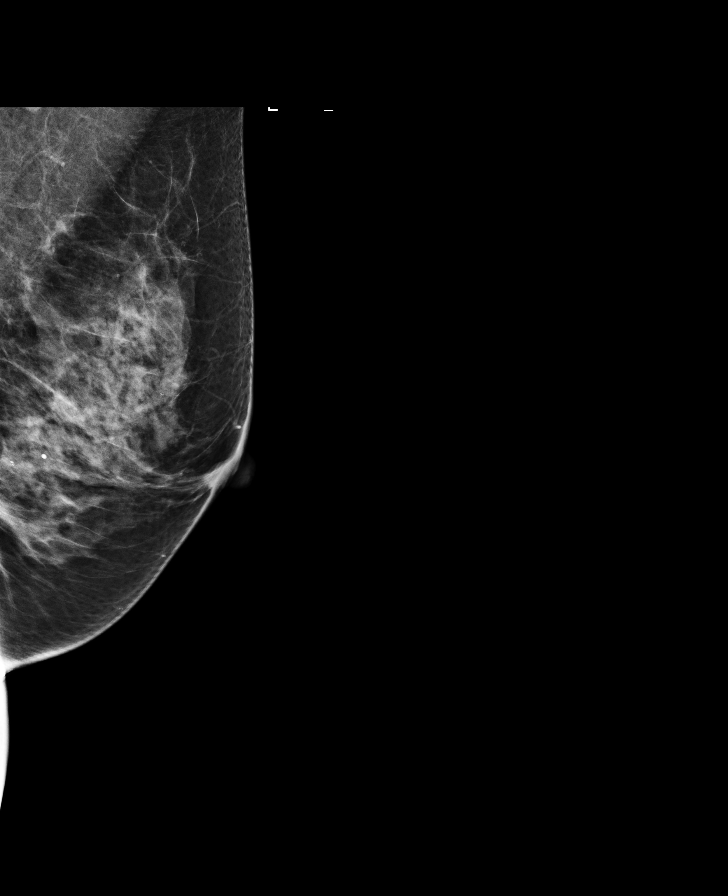

[L MLO (3 of 3)]
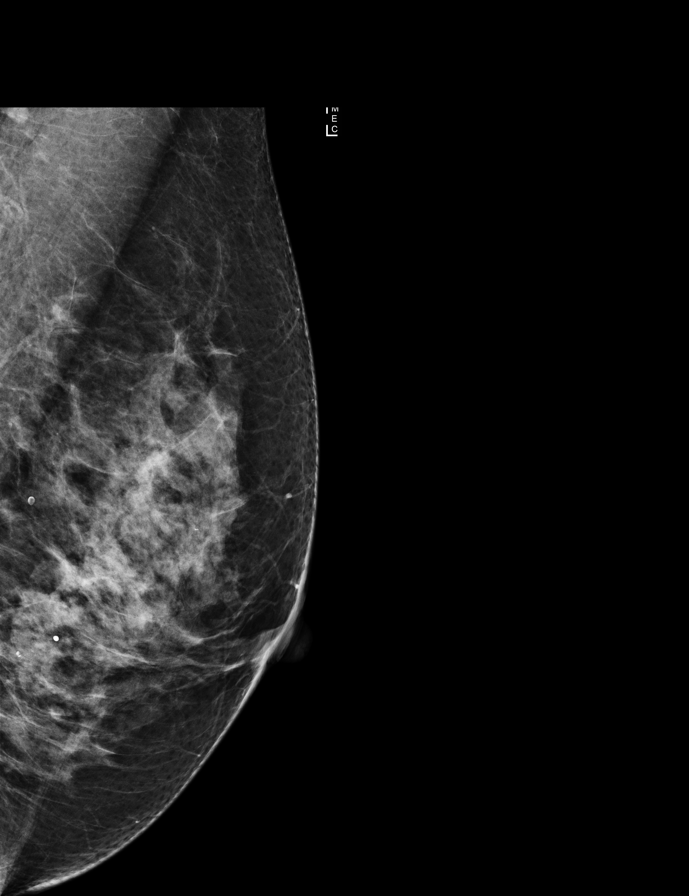

[R MLO (1 of 2)]
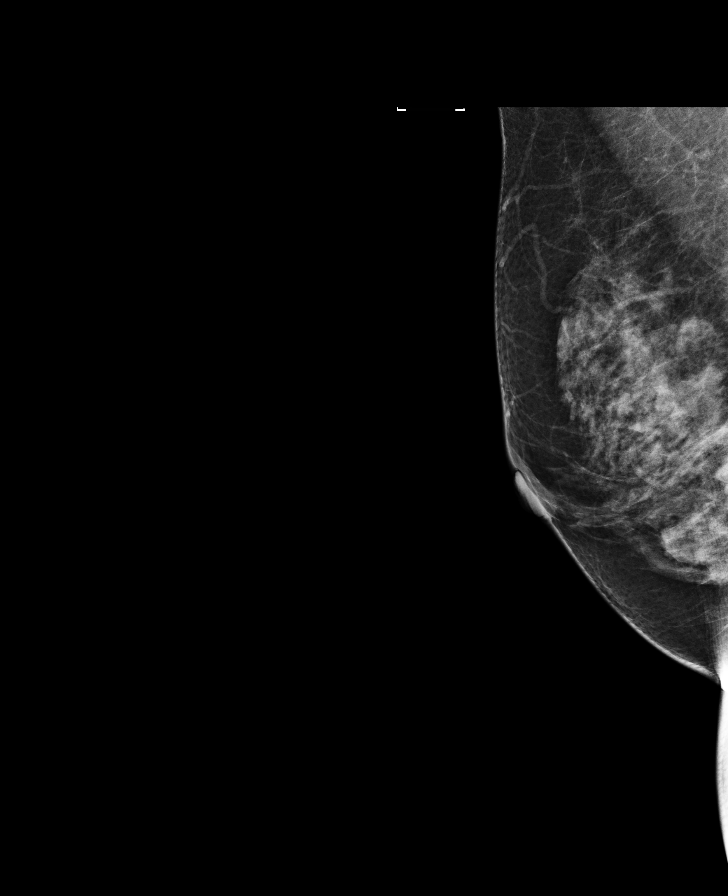

[R CC]
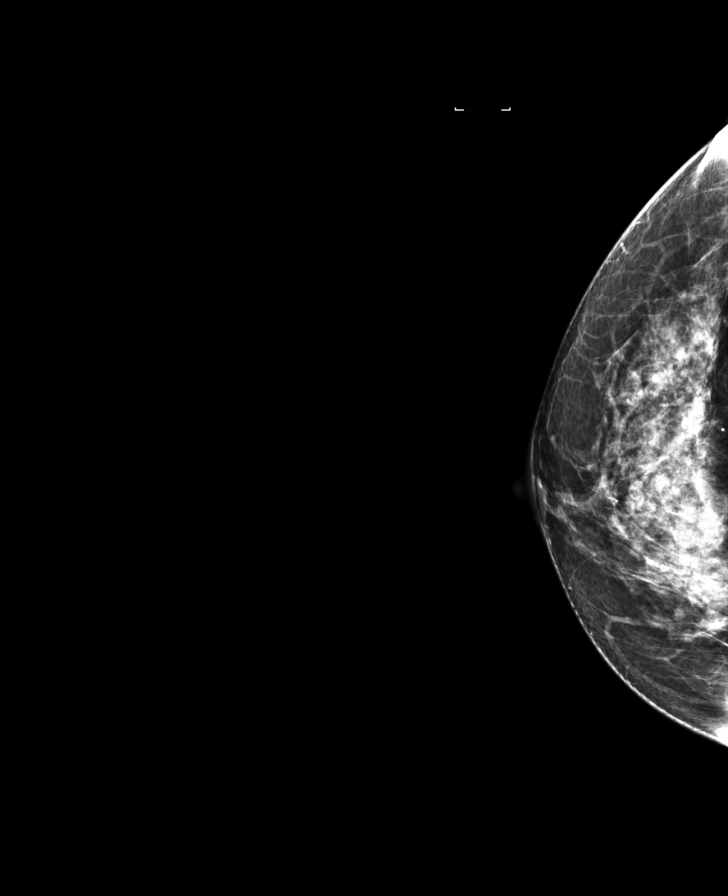

[R MLO (2 of 2)]
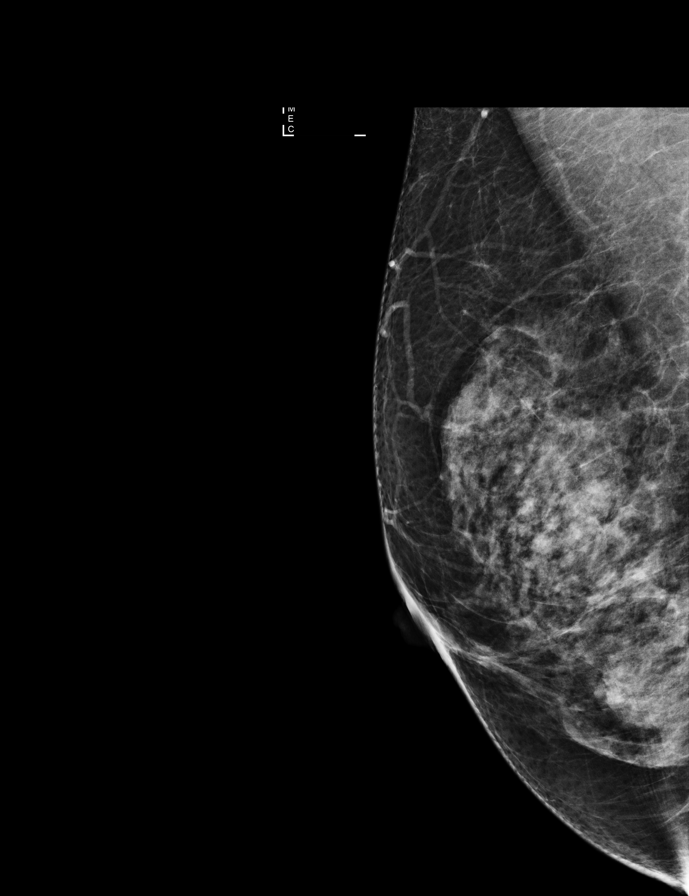

[7 of 7 positions shown; findings below may reference images not displayed]

ACR Breast Density Category c: The breast tissue is heterogeneously
dense, which may obscure small masses.
FINDINGS: There are no findings suspicious for malignancy. Images were
processed with CAD.
IMPRESSION: No mammographic evidence of malignancy. A result letter of this
screening mammogram will be mailed directly to the patient.

RECOMMENDATION:
Screening mammogram in one year. (Code:YJ-2-FEZ)

BI-RADS CATEGORY  1: Negative.

## 2019-06-09 DIAGNOSIS — Z3042 Encounter for surveillance of injectable contraceptive: Secondary | ICD-10-CM | POA: Diagnosis not present

## 2019-08-22 DIAGNOSIS — H2000 Unspecified acute and subacute iridocyclitis: Secondary | ICD-10-CM | POA: Diagnosis not present

## 2019-08-29 DIAGNOSIS — G250 Essential tremor: Secondary | ICD-10-CM | POA: Diagnosis not present

## 2019-08-29 DIAGNOSIS — R0989 Other specified symptoms and signs involving the circulatory and respiratory systems: Secondary | ICD-10-CM | POA: Diagnosis not present

## 2019-08-29 DIAGNOSIS — F419 Anxiety disorder, unspecified: Secondary | ICD-10-CM | POA: Diagnosis not present

## 2019-08-29 DIAGNOSIS — E7800 Pure hypercholesterolemia, unspecified: Secondary | ICD-10-CM | POA: Diagnosis not present

## 2019-09-02 DIAGNOSIS — Z3042 Encounter for surveillance of injectable contraceptive: Secondary | ICD-10-CM | POA: Diagnosis not present

## 2019-09-19 DIAGNOSIS — Z1211 Encounter for screening for malignant neoplasm of colon: Secondary | ICD-10-CM | POA: Diagnosis not present

## 2019-12-01 DIAGNOSIS — Z3042 Encounter for surveillance of injectable contraceptive: Secondary | ICD-10-CM | POA: Diagnosis not present

## 2020-02-20 DIAGNOSIS — H2000 Unspecified acute and subacute iridocyclitis: Secondary | ICD-10-CM | POA: Diagnosis not present

## 2020-03-01 ENCOUNTER — Other Ambulatory Visit: Payer: Self-pay | Admitting: Family Medicine

## 2020-03-01 DIAGNOSIS — Z1231 Encounter for screening mammogram for malignant neoplasm of breast: Secondary | ICD-10-CM

## 2020-03-05 DIAGNOSIS — E7800 Pure hypercholesterolemia, unspecified: Secondary | ICD-10-CM | POA: Diagnosis not present

## 2020-03-05 DIAGNOSIS — Z0000 Encounter for general adult medical examination without abnormal findings: Secondary | ICD-10-CM | POA: Diagnosis not present

## 2020-03-05 DIAGNOSIS — J301 Allergic rhinitis due to pollen: Secondary | ICD-10-CM | POA: Diagnosis not present

## 2020-03-05 DIAGNOSIS — F419 Anxiety disorder, unspecified: Secondary | ICD-10-CM | POA: Diagnosis not present

## 2020-03-05 DIAGNOSIS — Z79899 Other long term (current) drug therapy: Secondary | ICD-10-CM | POA: Diagnosis not present

## 2020-04-20 ENCOUNTER — Ambulatory Visit
Admission: RE | Admit: 2020-04-20 | Discharge: 2020-04-20 | Disposition: A | Discharge status: Home / Self Care | Payer: BC Managed Care – PPO | Source: Ambulatory Visit | Attending: Family Medicine | Admitting: Family Medicine

## 2020-04-20 ENCOUNTER — Other Ambulatory Visit: Payer: Self-pay

## 2020-04-20 DIAGNOSIS — Z1231 Encounter for screening mammogram for malignant neoplasm of breast: Secondary | ICD-10-CM | POA: Diagnosis not present

## 2020-09-16 DIAGNOSIS — K625 Hemorrhage of anus and rectum: Secondary | ICD-10-CM | POA: Diagnosis not present

## 2020-09-16 DIAGNOSIS — F419 Anxiety disorder, unspecified: Secondary | ICD-10-CM | POA: Diagnosis not present

## 2020-09-16 DIAGNOSIS — Z23 Encounter for immunization: Secondary | ICD-10-CM | POA: Diagnosis not present

## 2020-09-23 DIAGNOSIS — R634 Abnormal weight loss: Secondary | ICD-10-CM | POA: Diagnosis not present

## 2020-09-23 DIAGNOSIS — R112 Nausea with vomiting, unspecified: Secondary | ICD-10-CM | POA: Diagnosis not present

## 2020-09-23 DIAGNOSIS — K625 Hemorrhage of anus and rectum: Secondary | ICD-10-CM | POA: Diagnosis not present

## 2020-09-23 DIAGNOSIS — R1030 Lower abdominal pain, unspecified: Secondary | ICD-10-CM | POA: Diagnosis not present

## 2020-09-24 ENCOUNTER — Other Ambulatory Visit: Payer: Self-pay | Admitting: Physician Assistant

## 2020-09-24 DIAGNOSIS — R1030 Lower abdominal pain, unspecified: Secondary | ICD-10-CM

## 2020-09-24 DIAGNOSIS — R634 Abnormal weight loss: Secondary | ICD-10-CM

## 2020-09-24 DIAGNOSIS — R112 Nausea with vomiting, unspecified: Secondary | ICD-10-CM

## 2020-10-11 ENCOUNTER — Ambulatory Visit
Admission: RE | Admit: 2020-10-11 | Discharge: 2020-10-11 | Disposition: A | Discharge status: Home / Self Care | Payer: BC Managed Care – PPO | Source: Ambulatory Visit | Attending: Physician Assistant | Admitting: Physician Assistant

## 2020-10-11 ENCOUNTER — Other Ambulatory Visit: Payer: Self-pay

## 2020-10-11 DIAGNOSIS — R634 Abnormal weight loss: Secondary | ICD-10-CM | POA: Diagnosis not present

## 2020-10-11 DIAGNOSIS — K429 Umbilical hernia without obstruction or gangrene: Secondary | ICD-10-CM | POA: Diagnosis not present

## 2020-10-11 DIAGNOSIS — R112 Nausea with vomiting, unspecified: Secondary | ICD-10-CM

## 2020-10-11 DIAGNOSIS — R197 Diarrhea, unspecified: Secondary | ICD-10-CM | POA: Diagnosis not present

## 2020-10-11 DIAGNOSIS — R1030 Lower abdominal pain, unspecified: Secondary | ICD-10-CM

## 2020-10-11 MED ORDER — IOPAMIDOL (ISOVUE-300) INJECTION 61%
100.0000 mL | Freq: Once | INTRAVENOUS | Status: AC | PRN
  Administered 2020-10-11: 100 mL via INTRAVENOUS

## 2020-10-20 DIAGNOSIS — R933 Abnormal findings on diagnostic imaging of other parts of digestive tract: Secondary | ICD-10-CM | POA: Diagnosis not present

## 2020-10-20 DIAGNOSIS — K625 Hemorrhage of anus and rectum: Secondary | ICD-10-CM | POA: Diagnosis not present

## 2020-10-20 DIAGNOSIS — K648 Other hemorrhoids: Secondary | ICD-10-CM | POA: Diagnosis not present

## 2020-10-20 DIAGNOSIS — K635 Polyp of colon: Secondary | ICD-10-CM | POA: Diagnosis not present

## 2020-12-16 DIAGNOSIS — F419 Anxiety disorder, unspecified: Secondary | ICD-10-CM | POA: Diagnosis not present

## 2021-02-25 DIAGNOSIS — H2000 Unspecified acute and subacute iridocyclitis: Secondary | ICD-10-CM | POA: Diagnosis not present

## 2021-03-21 DIAGNOSIS — L74519 Primary focal hyperhidrosis, unspecified: Secondary | ICD-10-CM | POA: Diagnosis not present

## 2021-03-21 DIAGNOSIS — F419 Anxiety disorder, unspecified: Secondary | ICD-10-CM | POA: Diagnosis not present

## 2021-03-21 DIAGNOSIS — Z79899 Other long term (current) drug therapy: Secondary | ICD-10-CM | POA: Diagnosis not present

## 2021-03-21 DIAGNOSIS — E7800 Pure hypercholesterolemia, unspecified: Secondary | ICD-10-CM | POA: Diagnosis not present

## 2021-03-21 DIAGNOSIS — Z0000 Encounter for general adult medical examination without abnormal findings: Secondary | ICD-10-CM | POA: Diagnosis not present

## 2021-03-29 ENCOUNTER — Other Ambulatory Visit: Payer: Self-pay | Admitting: Family Medicine

## 2021-03-29 DIAGNOSIS — Z1231 Encounter for screening mammogram for malignant neoplasm of breast: Secondary | ICD-10-CM

## 2021-04-21 ENCOUNTER — Ambulatory Visit
Admission: RE | Admit: 2021-04-21 | Discharge: 2021-04-21 | Disposition: A | Discharge status: Home / Self Care | Payer: BC Managed Care – PPO | Source: Ambulatory Visit | Attending: Family Medicine | Admitting: Family Medicine

## 2021-04-21 DIAGNOSIS — Z1231 Encounter for screening mammogram for malignant neoplasm of breast: Secondary | ICD-10-CM | POA: Diagnosis not present

## 2021-09-16 DIAGNOSIS — M19072 Primary osteoarthritis, left ankle and foot: Secondary | ICD-10-CM | POA: Diagnosis not present

## 2021-09-16 DIAGNOSIS — M25572 Pain in left ankle and joints of left foot: Secondary | ICD-10-CM | POA: Diagnosis not present

## 2021-09-16 DIAGNOSIS — S92215A Nondisplaced fracture of cuboid bone of left foot, initial encounter for closed fracture: Secondary | ICD-10-CM | POA: Diagnosis not present

## 2021-09-16 DIAGNOSIS — W010XXA Fall on same level from slipping, tripping and stumbling without subsequent striking against object, initial encounter: Secondary | ICD-10-CM | POA: Diagnosis not present

## 2021-09-16 DIAGNOSIS — Z87891 Personal history of nicotine dependence: Secondary | ICD-10-CM | POA: Diagnosis not present

## 2021-09-16 DIAGNOSIS — S99912A Unspecified injury of left ankle, initial encounter: Secondary | ICD-10-CM | POA: Diagnosis not present

## 2021-09-22 DIAGNOSIS — E7800 Pure hypercholesterolemia, unspecified: Secondary | ICD-10-CM | POA: Diagnosis not present

## 2021-09-22 DIAGNOSIS — S92215D Nondisplaced fracture of cuboid bone of left foot, subsequent encounter for fracture with routine healing: Secondary | ICD-10-CM | POA: Diagnosis not present

## 2021-09-22 DIAGNOSIS — Z23 Encounter for immunization: Secondary | ICD-10-CM | POA: Diagnosis not present

## 2021-09-22 DIAGNOSIS — F419 Anxiety disorder, unspecified: Secondary | ICD-10-CM | POA: Diagnosis not present

## 2022-03-27 ENCOUNTER — Other Ambulatory Visit: Payer: Self-pay | Admitting: Family Medicine

## 2022-03-27 DIAGNOSIS — Z1231 Encounter for screening mammogram for malignant neoplasm of breast: Secondary | ICD-10-CM

## 2022-04-06 ENCOUNTER — Other Ambulatory Visit: Payer: Self-pay | Admitting: Family Medicine

## 2022-04-06 DIAGNOSIS — E559 Vitamin D deficiency, unspecified: Secondary | ICD-10-CM

## 2022-04-06 DIAGNOSIS — Z79899 Other long term (current) drug therapy: Secondary | ICD-10-CM

## 2022-04-06 DIAGNOSIS — Z8781 Personal history of (healed) traumatic fracture: Secondary | ICD-10-CM

## 2022-05-10 ENCOUNTER — Ambulatory Visit
Admission: RE | Admit: 2022-05-10 | Discharge: 2022-05-10 | Disposition: A | Discharge status: Home / Self Care | Payer: No Typology Code available for payment source | Source: Ambulatory Visit | Attending: Family Medicine

## 2022-05-10 DIAGNOSIS — Z1231 Encounter for screening mammogram for malignant neoplasm of breast: Secondary | ICD-10-CM

## 2022-09-08 ENCOUNTER — Ambulatory Visit
Admission: RE | Admit: 2022-09-08 | Discharge: 2022-09-08 | Disposition: A | Discharge status: Home / Self Care | Payer: No Typology Code available for payment source | Source: Ambulatory Visit | Attending: Family Medicine | Admitting: Family Medicine

## 2022-09-08 DIAGNOSIS — E559 Vitamin D deficiency, unspecified: Secondary | ICD-10-CM

## 2022-09-08 DIAGNOSIS — Z79899 Other long term (current) drug therapy: Secondary | ICD-10-CM

## 2022-09-08 DIAGNOSIS — Z8781 Personal history of (healed) traumatic fracture: Secondary | ICD-10-CM

## 2023-04-25 ENCOUNTER — Other Ambulatory Visit: Payer: Self-pay | Admitting: Family Medicine

## 2023-04-25 DIAGNOSIS — Z1231 Encounter for screening mammogram for malignant neoplasm of breast: Secondary | ICD-10-CM

## 2023-05-11 ENCOUNTER — Ambulatory Visit
Admission: RE | Admit: 2023-05-11 | Discharge: 2023-05-11 | Disposition: A | Discharge status: Home / Self Care | Source: Ambulatory Visit | Attending: Family Medicine | Admitting: Family Medicine

## 2023-05-11 DIAGNOSIS — Z1231 Encounter for screening mammogram for malignant neoplasm of breast: Secondary | ICD-10-CM

## 2023-05-17 ENCOUNTER — Other Ambulatory Visit: Payer: Self-pay | Admitting: Family Medicine

## 2023-05-17 DIAGNOSIS — R928 Other abnormal and inconclusive findings on diagnostic imaging of breast: Secondary | ICD-10-CM

## 2023-05-29 ENCOUNTER — Ambulatory Visit
Admission: RE | Admit: 2023-05-29 | Discharge: 2023-05-29 | Disposition: A | Discharge status: Home / Self Care | Source: Ambulatory Visit | Attending: Family Medicine

## 2023-05-29 DIAGNOSIS — R928 Other abnormal and inconclusive findings on diagnostic imaging of breast: Secondary | ICD-10-CM

## 2023-10-04 ENCOUNTER — Other Ambulatory Visit (HOSPITAL_BASED_OUTPATIENT_CLINIC_OR_DEPARTMENT_OTHER): Payer: Self-pay

## 2023-10-04 MED ORDER — PROPRANOLOL HCL 20 MG PO TABS
20.0000 mg | ORAL_TABLET | Freq: Two times a day (BID) | ORAL | 3 refills | Status: AC
  Filled 2023-10-04: qty 180, 90d supply, fill #0

## 2023-10-04 MED ORDER — SERTRALINE HCL 100 MG PO TABS
100.0000 mg | ORAL_TABLET | Freq: Every day | ORAL | 3 refills | Status: AC
  Filled 2023-10-04 – 2023-10-11 (×2): qty 90, 90d supply, fill #0

## 2023-10-04 MED ORDER — ROSUVASTATIN CALCIUM 5 MG PO TABS
5.0000 mg | ORAL_TABLET | Freq: Every day | ORAL | 3 refills | Status: AC
  Filled 2023-10-04: qty 90, 90d supply, fill #0

## 2023-10-04 MED ORDER — BUPROPION HCL ER (XL) 150 MG PO TB24
150.0000 mg | ORAL_TABLET | Freq: Every morning | ORAL | 3 refills | Status: AC
  Filled 2023-10-04: qty 90, 90d supply, fill #0

## 2023-10-11 ENCOUNTER — Other Ambulatory Visit (HOSPITAL_BASED_OUTPATIENT_CLINIC_OR_DEPARTMENT_OTHER): Payer: Self-pay

## 2023-10-11 ENCOUNTER — Other Ambulatory Visit: Payer: Self-pay
